# Patient Record
Sex: Male | Born: 1975 | Race: Black or African American | Hispanic: No | Marital: Married | Smoking: Never smoker
Health system: Southern US, Community
[De-identification: ages and names within clinical notes are randomized; demographics above are authoritative.]

## PROBLEM LIST (undated history)

## (undated) HISTORY — PX: WRIST SURGERY: SHX841

---

## 2006-10-23 ENCOUNTER — Emergency Department (HOSPITAL_COMMUNITY): Admission: EM | Admit: 2006-10-23 | Discharge: 2006-10-23 | Payer: Self-pay | Admitting: Emergency Medicine

## 2008-12-21 ENCOUNTER — Ambulatory Visit: Payer: Self-pay | Admitting: Gastroenterology

## 2010-09-14 ENCOUNTER — Emergency Department (HOSPITAL_COMMUNITY): Payer: Managed Care, Other (non HMO)

## 2010-09-14 ENCOUNTER — Emergency Department (HOSPITAL_COMMUNITY)
Admission: EM | Admit: 2010-09-14 | Discharge: 2010-09-14 | Disposition: A | Discharge status: Home / Self Care | Payer: Managed Care, Other (non HMO) | Attending: Emergency Medicine | Admitting: Emergency Medicine

## 2010-09-14 DIAGNOSIS — R110 Nausea: Secondary | ICD-10-CM | POA: Insufficient documentation

## 2010-09-14 DIAGNOSIS — R1032 Left lower quadrant pain: Secondary | ICD-10-CM | POA: Insufficient documentation

## 2010-09-14 DIAGNOSIS — R197 Diarrhea, unspecified: Secondary | ICD-10-CM | POA: Insufficient documentation

## 2010-09-14 LAB — COMPREHENSIVE METABOLIC PANEL
ALT: 52 U/L (ref 0–53)
AST: 38 U/L — ABNORMAL HIGH (ref 0–37)
Albumin: 3.7 g/dL (ref 3.5–5.2)
Alkaline Phosphatase: 54 U/L (ref 39–117)
Calcium: 9.3 mg/dL (ref 8.4–10.5)
GFR calc Af Amer: >60 mL/min (ref >60)
Glucose, Bld: 88 mg/dL (ref 70–99)
Potassium: 3.9 mEq/L (ref 3.5–5.1)
Sodium: 138 mEq/L (ref 135–145)
Total Protein: 7 g/dL (ref 6.0–8.3)

## 2010-09-14 LAB — DIFFERENTIAL
Basophils Absolute: 0 10*3/uL (ref 0.0–0.1)
Basophils Relative: 0 % (ref 0–1)
Eosinophils Relative: 3 % (ref 0–5)
Lymphocytes Relative: 31 % (ref 12–46)
Neutro Abs: 3.1 10*3/uL (ref 1.7–7.7)

## 2010-09-14 LAB — CBC
HCT: 42.8 % (ref 39.0–52.0)
Platelets: 174 10*3/uL (ref 150–400)
RDW: 12.1 % (ref 11.5–15.5)
WBC: 5.6 10*3/uL (ref 4.0–10.5)

## 2010-09-14 LAB — URINALYSIS, ROUTINE W REFLEX MICROSCOPIC
Bilirubin Urine: NEGATIVE
Glucose, UA: NEGATIVE mg/dL
Hgb urine dipstick: NEGATIVE
Ketones, ur: NEGATIVE mg/dL
Specific Gravity, Urine: 1.019 (ref 1.005–1.030)
pH: 6.5 (ref 5.0–8.0)

## 2010-09-14 MED ORDER — IOHEXOL 300 MG/ML  SOLN: 100.0000 mL | Freq: Once | INTRAMUSCULAR | Status: DC | PRN

## 2012-04-23 ENCOUNTER — Emergency Department: Payer: Self-pay | Admitting: Emergency Medicine

## 2012-09-22 IMAGING — CT CT ABD-PELV W/ CM
2 of 4 series · 17 of 46 positions shown, 19 images · IV contrast (agent unspecified)
Comparison: None.

CLINICAL DATA: Left lower quadrant abdominal pain.  Diarrhea.

CT ABDOMEN AND PELVIS WITH CONTRAST
TECHNIQUE: Multidetector CT imaging of the abdomen and pelvis was
performed following the standard protocol during bolus
administration of intravenous contrast.
Contrast: 100 ml Tmnipaque-EBB

[Series 3: routine · axial · 0.84mm/px · z∈[-486,-56]mm · 14 of 96 slices shown, 16 images]
[im 5/96  soft-tissue]
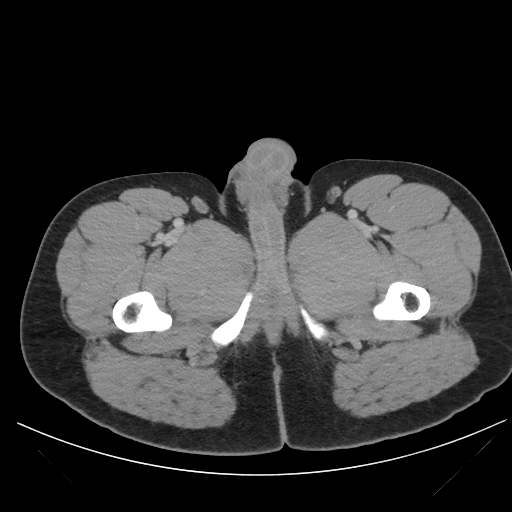
[im 5/96  bone]
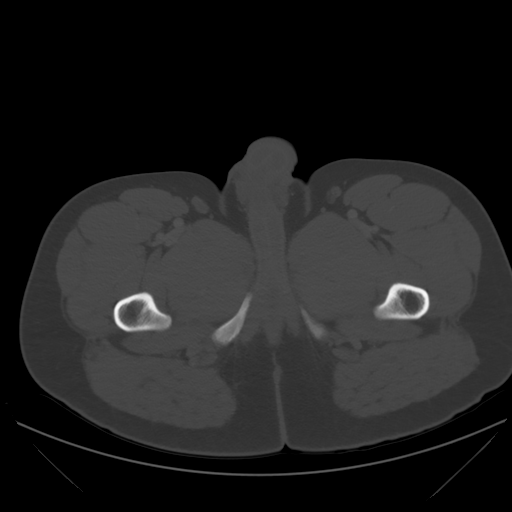
[im 13/96  soft-tissue]
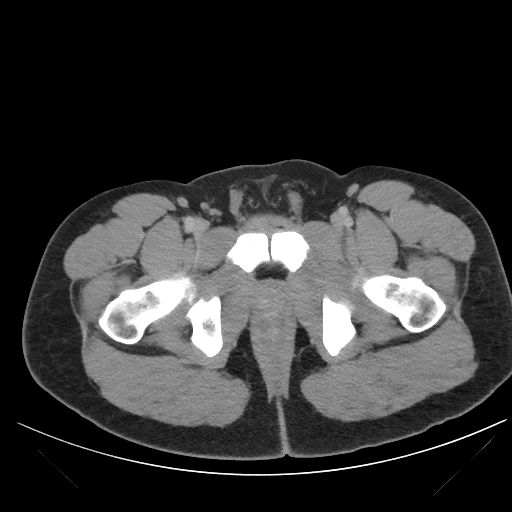
[im 17/96  soft-tissue]
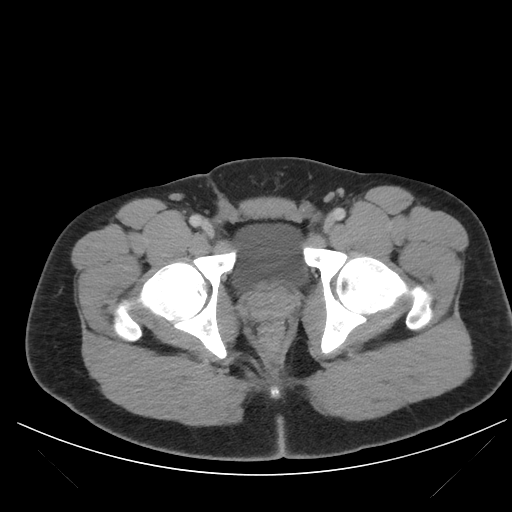
[im 25/96  soft-tissue]
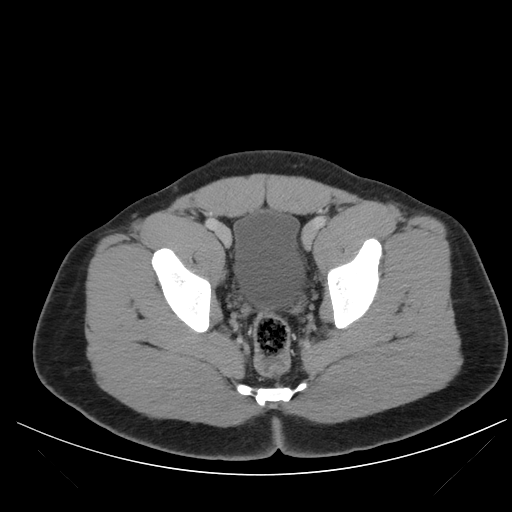
[im 34/96  soft-tissue]
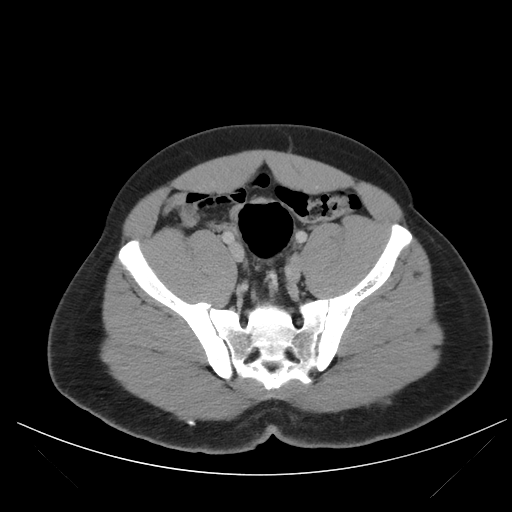
[im 38/96  soft-tissue]
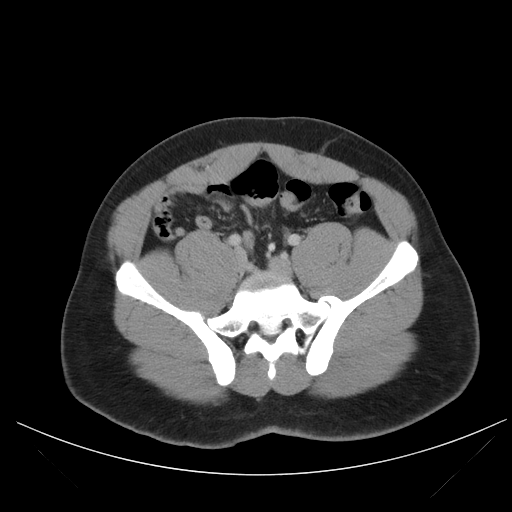
[im 46/96  soft-tissue]
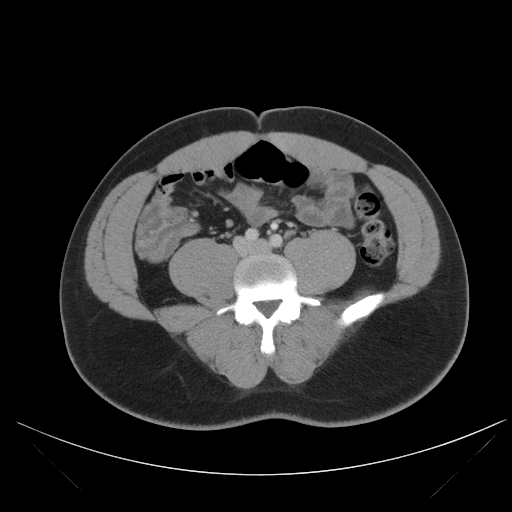
[im 50/96  soft-tissue]
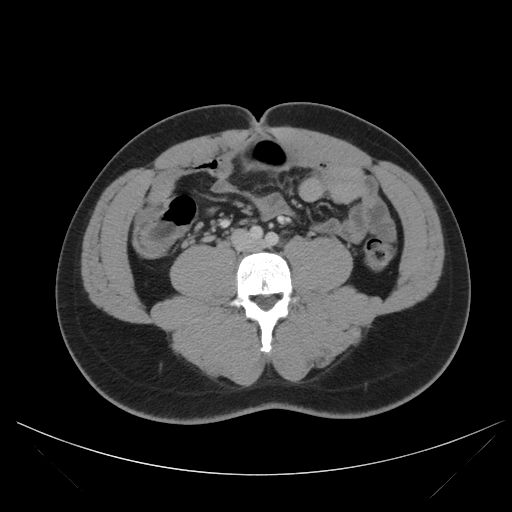
[im 58/96  soft-tissue]
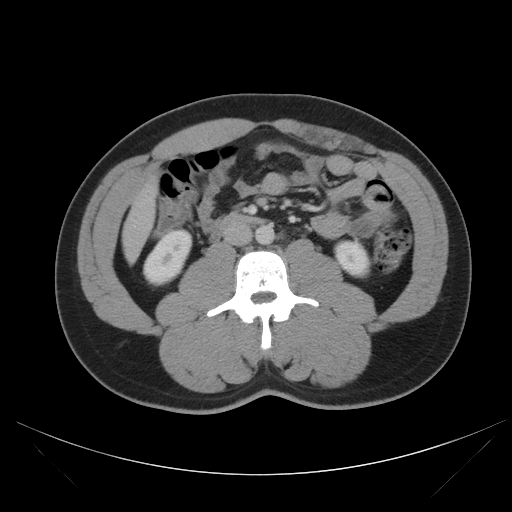
[im 58/96  bone]
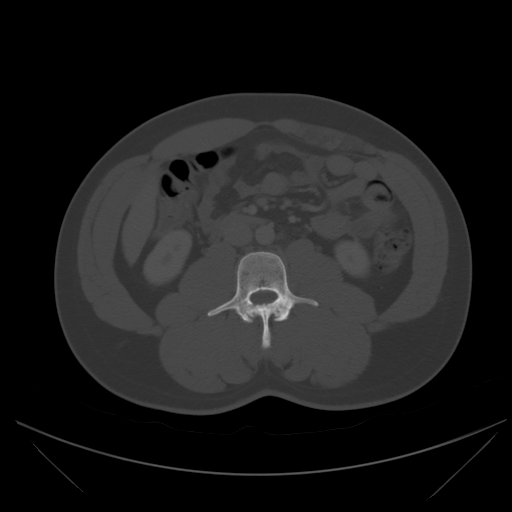
[im 62/96  soft-tissue]
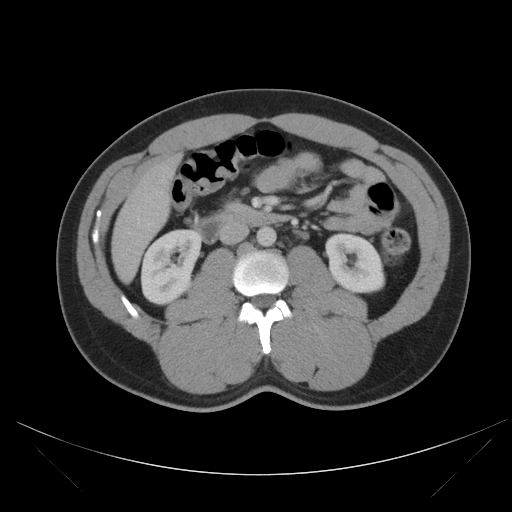
[im 71/96  soft-tissue]
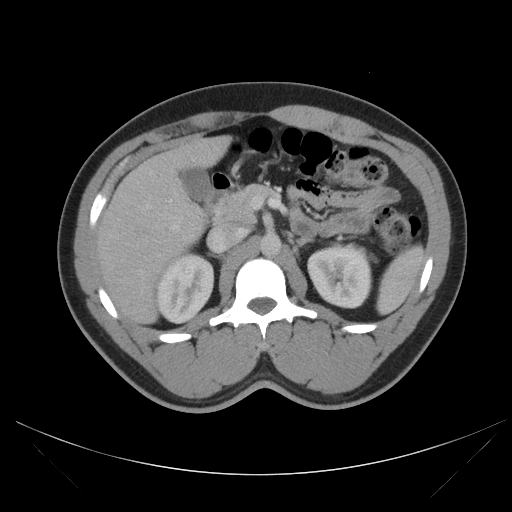
[im 79/96  soft-tissue]
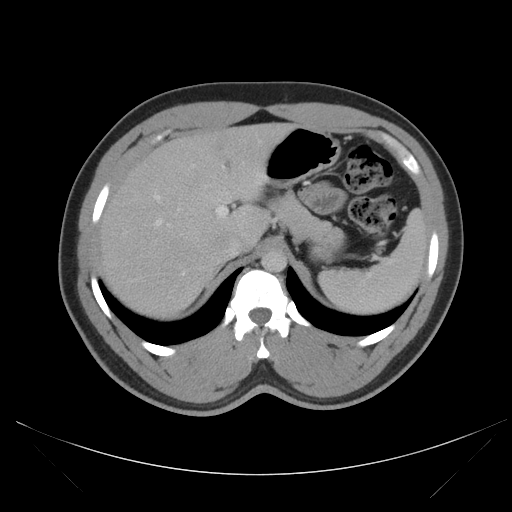
[im 83/96  soft-tissue]
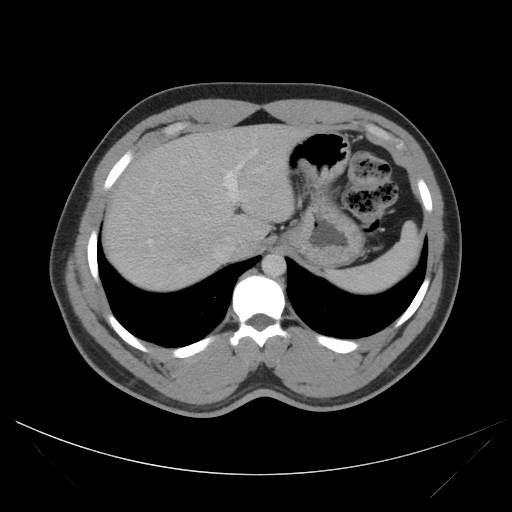
[im 91/96  soft-tissue]
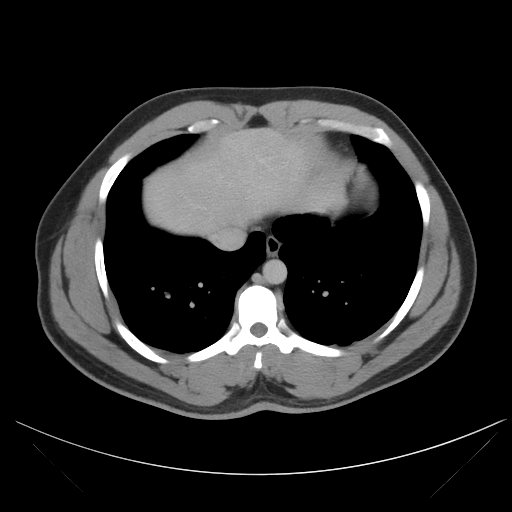

[cor · coronal · 0.93mm/px · 3 of 104 slices shown]
[im 35/104  soft-tissue]
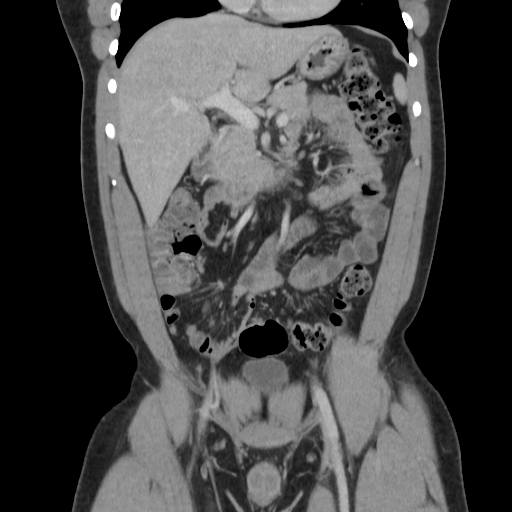
[im 46/104  soft-tissue]
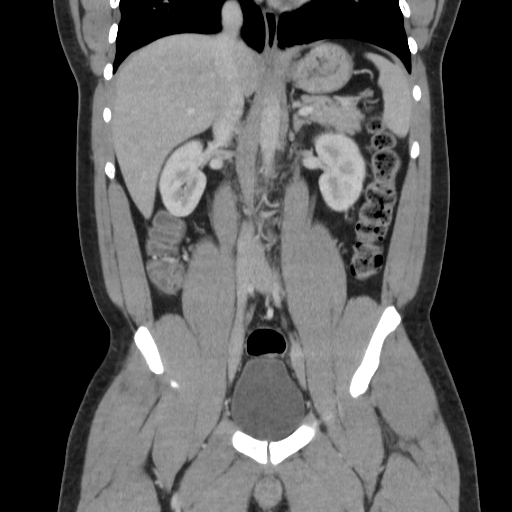
[im 58/104  soft-tissue]
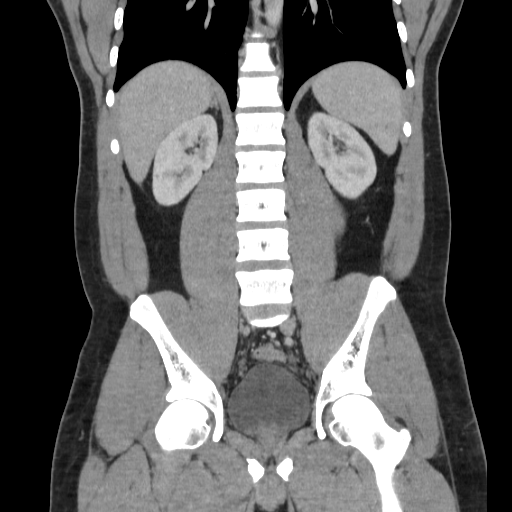

[17 of 46 positions shown; findings below may reference images not displayed]

FINDINGS: Normal appearing liver, spleen, pancreas, gallbladder,
adrenal glands, kidneys, urinary bladder and prostate gland with
the exception of minimal central prostatic calcification.  No
gastrointestinal abnormalities or enlarged lymph nodes.  Normal
appearing appendix in the right upper pelvis, anteriorly.  Clear
lung bases.  Normal appearing bones.
IMPRESSION: No significant abnormality.

## 2018-01-19 ENCOUNTER — Encounter (HOSPITAL_COMMUNITY): Payer: Self-pay | Admitting: Emergency Medicine

## 2018-01-19 ENCOUNTER — Emergency Department (HOSPITAL_COMMUNITY)
Admission: EM | Admit: 2018-01-19 | Discharge: 2018-01-19 | Disposition: A | Discharge status: Home / Self Care | Payer: 59 | Attending: Emergency Medicine | Admitting: Emergency Medicine

## 2018-01-19 ENCOUNTER — Other Ambulatory Visit: Payer: Self-pay

## 2018-01-19 DIAGNOSIS — R42 Dizziness and giddiness: Secondary | ICD-10-CM | POA: Diagnosis not present

## 2018-01-19 DIAGNOSIS — R112 Nausea with vomiting, unspecified: Secondary | ICD-10-CM | POA: Insufficient documentation

## 2018-01-19 DIAGNOSIS — R197 Diarrhea, unspecified: Secondary | ICD-10-CM | POA: Diagnosis not present

## 2018-01-19 LAB — URINALYSIS, ROUTINE W REFLEX MICROSCOPIC
BILIRUBIN URINE: NEGATIVE
Glucose, UA: NEGATIVE mg/dL
Hgb urine dipstick: NEGATIVE
KETONES UR: NEGATIVE mg/dL
Leukocytes, UA: NEGATIVE
NITRITE: NEGATIVE
PROTEIN: NEGATIVE mg/dL
Specific Gravity, Urine: 1.018 (ref 1.005–1.030)
pH: 6 (ref 5.0–8.0)

## 2018-01-19 LAB — CBC
HCT: 42.6 % (ref 39.0–52.0)
Hemoglobin: 14.6 g/dL (ref 13.0–17.0)
MCH: 32.4 pg (ref 26.0–34.0)
MCHC: 34.3 g/dL (ref 30.0–36.0)
MCV: 94.7 fL (ref 78.0–100.0)
Platelets: 182 10*3/uL (ref 150–400)
RBC: 4.5 MIL/uL (ref 4.22–5.81)
RDW: 11.9 % (ref 11.5–15.5)
WBC: 3.8 10*3/uL — ABNORMAL LOW (ref 4.0–10.5)

## 2018-01-19 LAB — COMPREHENSIVE METABOLIC PANEL
ALBUMIN: 4.1 g/dL (ref 3.5–5.0)
ALT: 26 U/L (ref 0–44)
ANION GAP: 8 (ref 5–15)
AST: 28 U/L (ref 15–41)
Alkaline Phosphatase: 37 U/L — ABNORMAL LOW (ref 38–126)
BILIRUBIN TOTAL: 0.6 mg/dL (ref 0.3–1.2)
BUN: 10 mg/dL (ref 6–20)
CALCIUM: 9.2 mg/dL (ref 8.9–10.3)
CO2: 26 mmol/L (ref 22–32)
Chloride: 104 mmol/L (ref 98–111)
Creatinine, Ser: 1.32 mg/dL — ABNORMAL HIGH (ref 0.61–1.24)
GFR calc Af Amer: >60 mL/min (ref >60)
GLUCOSE: 108 mg/dL — AB (ref 70–99)
POTASSIUM: 4 mmol/L (ref 3.5–5.1)
Sodium: 138 mmol/L (ref 135–145)
TOTAL PROTEIN: 7.4 g/dL (ref 6.5–8.1)

## 2018-01-19 LAB — LIPASE, BLOOD: Lipase: 58 U/L — ABNORMAL HIGH (ref 11–51)

## 2018-01-19 LAB — TROPONIN I

## 2018-01-19 MED ORDER — ONDANSETRON HCL 4 MG/2ML IJ SOLN
4.0000 mg | Freq: Once | INTRAMUSCULAR | Status: AC
  Administered 2018-01-19: 4 mg via INTRAVENOUS
  Filled 2018-01-19: qty 2

## 2018-01-19 MED ORDER — ONDANSETRON 4 MG PO TBDP: 4.0000 mg | ORAL_TABLET | Freq: Three times a day (TID) | ORAL | 0 refills | Status: AC | PRN

## 2018-01-19 MED ORDER — DICYCLOMINE HCL 20 MG PO TABS: 20.0000 mg | ORAL_TABLET | Freq: Two times a day (BID) | ORAL | 0 refills | Status: AC

## 2018-01-19 MED ORDER — DICYCLOMINE HCL 10 MG PO CAPS
20.0000 mg | ORAL_CAPSULE | Freq: Once | ORAL | Status: AC
  Administered 2018-01-19: 20 mg via ORAL
  Filled 2018-01-19: qty 2

## 2018-01-19 MED ORDER — SODIUM CHLORIDE 0.9 % IV BOLUS
1000.0000 mL | Freq: Once | INTRAVENOUS | Status: AC
Start: 2018-01-19 — End: 2018-01-19
  Administered 2018-01-19: 1000 mL via INTRAVENOUS

## 2018-01-19 NOTE — ED Notes (Signed)
Pt ambulated without assistance to restroom with steady gait.

## 2018-01-19 NOTE — ED Provider Notes (Signed)
[facility] EMERGENCY DEPARTMENT Provider Note   CSN: [id number] Arrival date & time: 01/19/18  0941     History   Chief Complaint Chief Complaint  Patient presents with  . Dizziness  . Abdominal Pain    HPI Devon Fisher is a 42 y.o. male.  HPI   Devon Fisher is a 42 y.o. male, patient with no pertinent past medical history, presenting to the ED with dizziness.  Patient states he was at work when he began to feel room spinning dizziness and lightheadedness followed closely by a "gurgling" and cramping in the left side of the abdomen.  Accompanied by nausea.  Patient left work and went home where he had an instance of diarrhea.  On his way to the ED, he vomited. Symptoms improved after vomiting.   Patient's symptoms arose not long after eating a sandwich that he suspects may have been the cause for the symptoms.  Denies fever, chest pain, shortness of breath, hematochezia/melena, hematemesis, syncope, or any other complaints.    History reviewed. No pertinent past medical history.  There are no active problems to display for this patient.   Past Surgical History:  Procedure Laterality Date  . WRIST SURGERY Right         Home Medications    Prior to Admission medications   Medication Sig Start Date End Date Taking? Authorizing Provider  naproxen sodium (ALEVE) 220 MG tablet Take 220 mg by mouth daily as needed (Headache).   Yes [provider]  dicyclomine (BENTYL) 20 MG tablet Take 1 tablet (20 mg total) by mouth 2 (two) times daily. 01/19/18   Obert Espindola C, PA-C  ondansetron (ZOFRAN ODT) 4 MG disintegrating tablet Take 1 tablet (4 mg total) by mouth every 8 (eight) hours as needed for nausea or vomiting. 01/19/18   Salih Williamson, Hillard Danker, PA-C    Family History No family history on file.  Social History Social History   Tobacco Use  . Smoking status: Never Smoker  . Smokeless tobacco: Never Used  Substance Use Topics  . Alcohol use: Not  Currently  . Drug use: Not on file     Allergies   Aspirin and Penicillins   Review of Systems Review of Systems  Constitutional: Negative for chills and fever.  Respiratory: Negative for shortness of breath.   Cardiovascular: Negative for chest pain.  Gastrointestinal: Positive for abdominal pain, diarrhea, nausea and vomiting. Negative for blood in stool.  Genitourinary: Negative for dysuria, frequency, hematuria and testicular pain.  Musculoskeletal: Negative for back pain.  Neurological: Positive for dizziness and light-headedness. Negative for syncope, weakness, numbness and headaches.  All other systems reviewed and are negative.    Physical Exam Updated Vital Signs BP 128/80 (BP Location: Right Arm)   Pulse 87   Temp 97.9 F (36.6 C) (Oral)   Resp 16   Ht 5\' 11"  (1.803 m)   Wt 104.3 kg   SpO2 100%   BMI 32.08 kg/m   Physical Exam  Constitutional: He is oriented to person, place, and time. He appears well-developed and well-nourished. No distress.  HENT:  Head: Normocephalic and atraumatic.  Eyes: Pupils are equal, round, and reactive to light. Conjunctivae and EOM are normal.  Neck: Neck supple.  Cardiovascular: Normal rate, regular rhythm, normal heart sounds and intact distal pulses.  Pulmonary/Chest: Effort normal and breath sounds normal. No respiratory distress.  Abdominal: Soft. There is no tenderness. There is no guarding.  Musculoskeletal: He exhibits no edema.  Lymphadenopathy:    He has no cervical adenopathy.  Neurological: He is alert and oriented to person, place, and time.  Sensation grossly intact to light touch in the extremities. Strength 5/5 in all extremities. No gait disturbance. Coordination intact. Cranial nerves III-XII grossly intact. No facial droop.   HINTS-Plus Exam Head Impulse: Abnormal, thus reassuring Nystagmus: Unidirectional nystagmus noted. No bidirectional, rotation, or vertical nystagmus noted. Test of Skew: No abnormal  skew Hearing: No noted acute hearing deficit with finger rub testing  Skin: Skin is warm and dry. He is not diaphoretic.  Psychiatric: He has a normal mood and affect. His behavior is normal.  Nursing note and vitals reviewed.    ED Treatments / Results  Labs (all labs ordered are listed, but only abnormal results are displayed) Labs Reviewed  LIPASE, BLOOD - Abnormal; Notable for the following components:      Result Value   Lipase 58 (*)    All other components within normal limits  COMPREHENSIVE METABOLIC PANEL - Abnormal; Notable for the following components:   Glucose, Bld 108 (*)    Creatinine, Ser 1.32 (*)    Alkaline Phosphatase 37 (*)    All other components within normal limits  CBC - Abnormal; Notable for the following components:   WBC 3.8 (*)    All other components within normal limits  URINALYSIS, ROUTINE W REFLEX MICROSCOPIC  TROPONIN I   BUN  Date Value Ref Range Status  01/19/2018 10 6 - 20 mg/dL Final  16/01/9603 13 6 - 23 mg/dL Final   Creatinine, Ser  Date Value Ref Range Status  01/19/2018 1.32 (H) 0.61 - 1.24 mg/dL Final  54/12/8117 1.47 0.4 - 1.5 mg/dL Final     EKG EKG Interpretation  Date/Time:  Tuesday January 19 2018 09:46:02 EDT Ventricular Rate:  89 PR Interval:  164 QRS Duration: 80 QT Interval:  358 QTC Calculation: 435 R Axis:   94 Text Interpretation:  Normal sinus rhythm Rightward axis Borderline ECG Confirmed by Kennis Carina [phone]) on 01/19/2018 12:37:06 PM   Radiology No results found.  Procedures Procedures (including critical care time)  Medications Ordered in ED Medications  sodium chloride 0.9 % bolus 1,000 mL (0 mLs Intravenous Stopped 01/19/18 1355)  ondansetron (ZOFRAN) injection 4 mg (4 mg Intravenous Given 01/19/18 1212)  dicyclomine (BENTYL) capsule 20 mg (20 mg Oral Given 01/19/18 1243)     Initial Impression / Assessment and Plan / ED Course  I have reviewed the triage vital signs and the nursing  notes.  Pertinent labs & imaging results that were available during my care of the patient were reviewed by me and considered in my medical decision making (see chart for details).  Clinical Course as of Jan 19 1613  Tue Jan 19, 2018  1245 Patient states symptoms have resolved. He was able to get out of bed without noted hesitation and ambulated with a steady gait.   [SJ]    Clinical Course User Index [SJ] Garrie Elenes C, PA-C    Patient presents with complaint of lightheadedness, nausea, vomiting, and diarrhea.  Patient is nontoxic appearing, afebrile, not tachycardic, not tachypneic, not hypotensive, maintains excellent SPO2 on room air, and is in no apparent distress. Patient's symptoms could have been food related.  His symptoms resolved with minimal intervention and did not recur. The patient was given instructions for home care as well as return precautions. Patient voices understanding of these instructions, accepts the plan, and is comfortable with discharge.  Vitals:   01/19/18 1228 01/19/18 1239 01/19/18 1245 01/19/18 1345  BP:    128/74  Pulse: 70 78 79 78  Resp: 18 18 16 16   Temp:      TempSrc:      SpO2: 100% 100% 100% 100%  Weight:      Height:       Orthostatic VS for the past 24 hrs:  BP- Lying Pulse- Lying BP- Sitting Pulse- Sitting BP- Standing at 0 minutes Pulse- Standing at 0 minutes  01/19/18 1203 129/82 83 126/87 73 122/89 69     Final Clinical Impressions(s) / ED Diagnoses   Final diagnoses:  Lightheadedness    ED Discharge Orders         Ordered    ondansetron (ZOFRAN ODT) 4 MG disintegrating tablet  Every 8 hours PRN     01/19/18 1344    dicyclomine (BENTYL) 20 MG tablet  2 times daily     01/19/18 1344           Anselm Pancoast, PA-C 01/19/18 1615    Sabas Sous, MD 01/20/18 1413

## 2018-01-19 NOTE — ED Notes (Signed)
Pt stable, ambulatory, and verbalizes understanding of d/c instructions.  

## 2018-01-19 NOTE — ED Notes (Signed)
ED Provider at bedside. 

## 2018-01-19 NOTE — Discharge Instructions (Addendum)
Nausea, Vomiting, and Diarrhea ° °Hand washing: Wash your hands throughout the day, but especially before and after touching the face, using the restroom, sneezing, coughing, or touching surfaces that have been coughed or sneezed upon. °Hydration: Symptoms will be intensified and complicated by dehydration. Dehydration can also extend the duration of symptoms. Drink plenty of fluids and get plenty of rest. You should be drinking at least half a liter of water an hour to stay hydrated. Electrolyte drinks (ex. Gatorade, Powerade, Pedialyte) are also encouraged. You should be drinking enough fluids to make your urine light yellow, almost clear. If this is not the case, you are not drinking enough water. °Please note that some of the treatments indicated below will not be effective if you are not adequately hydrated. °Diet: Please concentrate on hydration, however, you may introduce food slowly.  Start with a clear liquid diet, progressed to a full liquid diet, and then bland solids as you are able. °Pain or fever: Ibuprofen, Naproxen, or Tylenol for pain or fever.  °Nausea/vomiting: Use the Zofran for nausea or vomiting. °Diarrhea: May use medications such as loperamide (Imodium) or Bismuth subsalicylate (Pepto-Bismol). °Bentyl: This medication is what is known as an antispasmodic and is intended to help reduce abdominal discomfort. °Follow-up: Follow-up with a primary care provider on this matter. °Return: Return should you develop a fever, bloody diarrhea, increased abdominal pain, uncontrolled vomiting, or any other major concerns. ° °For prescription assistance, may try using prescription discount sites or apps, such as goodrx.com °

## 2018-01-19 NOTE — ED Triage Notes (Signed)
Patient reports at work today he had a sudden onset of dizziness with emesis and diarrhea. Pt reports feeling better after vomitting but sat down and dizziness and has been constant. No LOC.

## 2019-05-21 ENCOUNTER — Other Ambulatory Visit: Payer: Self-pay

## 2019-05-21 DIAGNOSIS — Z20822 Contact with and (suspected) exposure to covid-19: Secondary | ICD-10-CM

## 2019-05-22 LAB — NOVEL CORONAVIRUS, NAA: SARS-CoV-2, NAA: NOT DETECTED

## 2019-08-13 ENCOUNTER — Ambulatory Visit: Payer: Managed Care, Other (non HMO) | Attending: Internal Medicine

## 2019-08-13 DIAGNOSIS — Z23 Encounter for immunization: Secondary | ICD-10-CM

## 2019-08-13 NOTE — Progress Notes (Signed)
   Covid-19 Vaccination [facility]  Name:  Devon Fisher    MRN: [id number] DOB: May 21, 1975  08/13/2019  [name] was observed post Covid-19 immunization for 15 minutes without incident. He was provided with Vaccine Information Sheet and instruction to access the V-Safe system.   [name] was instructed to call [phone] with any severe reactions post vaccine: Marland Kitchen Difficulty breathing  . Swelling of face and throat  . A fast heartbeat  . A bad rash all over body  . Dizziness and weakness   Immunizations Administered    Name Date Dose VIS Date Route   Pfizer COVID-19 Vaccine 08/13/2019  3:50 PM 0.3 mL 06/15/2018 Intramuscular   Manufacturer: [employer], [employer]   Lot: [id number]   NDC: [address]-4

## 2021-05-09 ENCOUNTER — Other Ambulatory Visit: Payer: Self-pay | Admitting: Sports Medicine

## 2021-05-09 ENCOUNTER — Other Ambulatory Visit: Payer: Self-pay

## 2021-05-09 ENCOUNTER — Ambulatory Visit
Admission: RE | Admit: 2021-05-09 | Discharge: 2021-05-09 | Disposition: A | Discharge status: Home / Self Care | Payer: Managed Care, Other (non HMO) | Source: Ambulatory Visit | Attending: Sports Medicine | Admitting: Sports Medicine

## 2021-05-09 DIAGNOSIS — M542 Cervicalgia: Secondary | ICD-10-CM

## 2021-05-09 DIAGNOSIS — M25512 Pain in left shoulder: Secondary | ICD-10-CM

## 2021-05-09 DIAGNOSIS — M25511 Pain in right shoulder: Secondary | ICD-10-CM

## 2022-09-18 ENCOUNTER — Ambulatory Visit: Payer: Commercial Managed Care - HMO | Admitting: Physical Therapy

## 2023-04-22 ENCOUNTER — Other Ambulatory Visit: Payer: Self-pay | Admitting: Medical Genetics

## 2023-05-14 ENCOUNTER — Other Ambulatory Visit (HOSPITAL_COMMUNITY)
Admission: RE | Admit: 2023-05-14 | Discharge: 2023-05-14 | Disposition: A | Discharge status: Home / Self Care | Payer: Self-pay | Source: Ambulatory Visit | Attending: Oncology | Admitting: Oncology

## 2023-05-18 IMAGING — CR DG SHOULDER 2+V*R*
3 series · 3 of 3 positions shown · non-contrast
Comparison: None.

CLINICAL DATA: Right shoulder pain.

EXAM:
RIGHT SHOULDER - 2+ VIEW

[w shoulder grashey right]
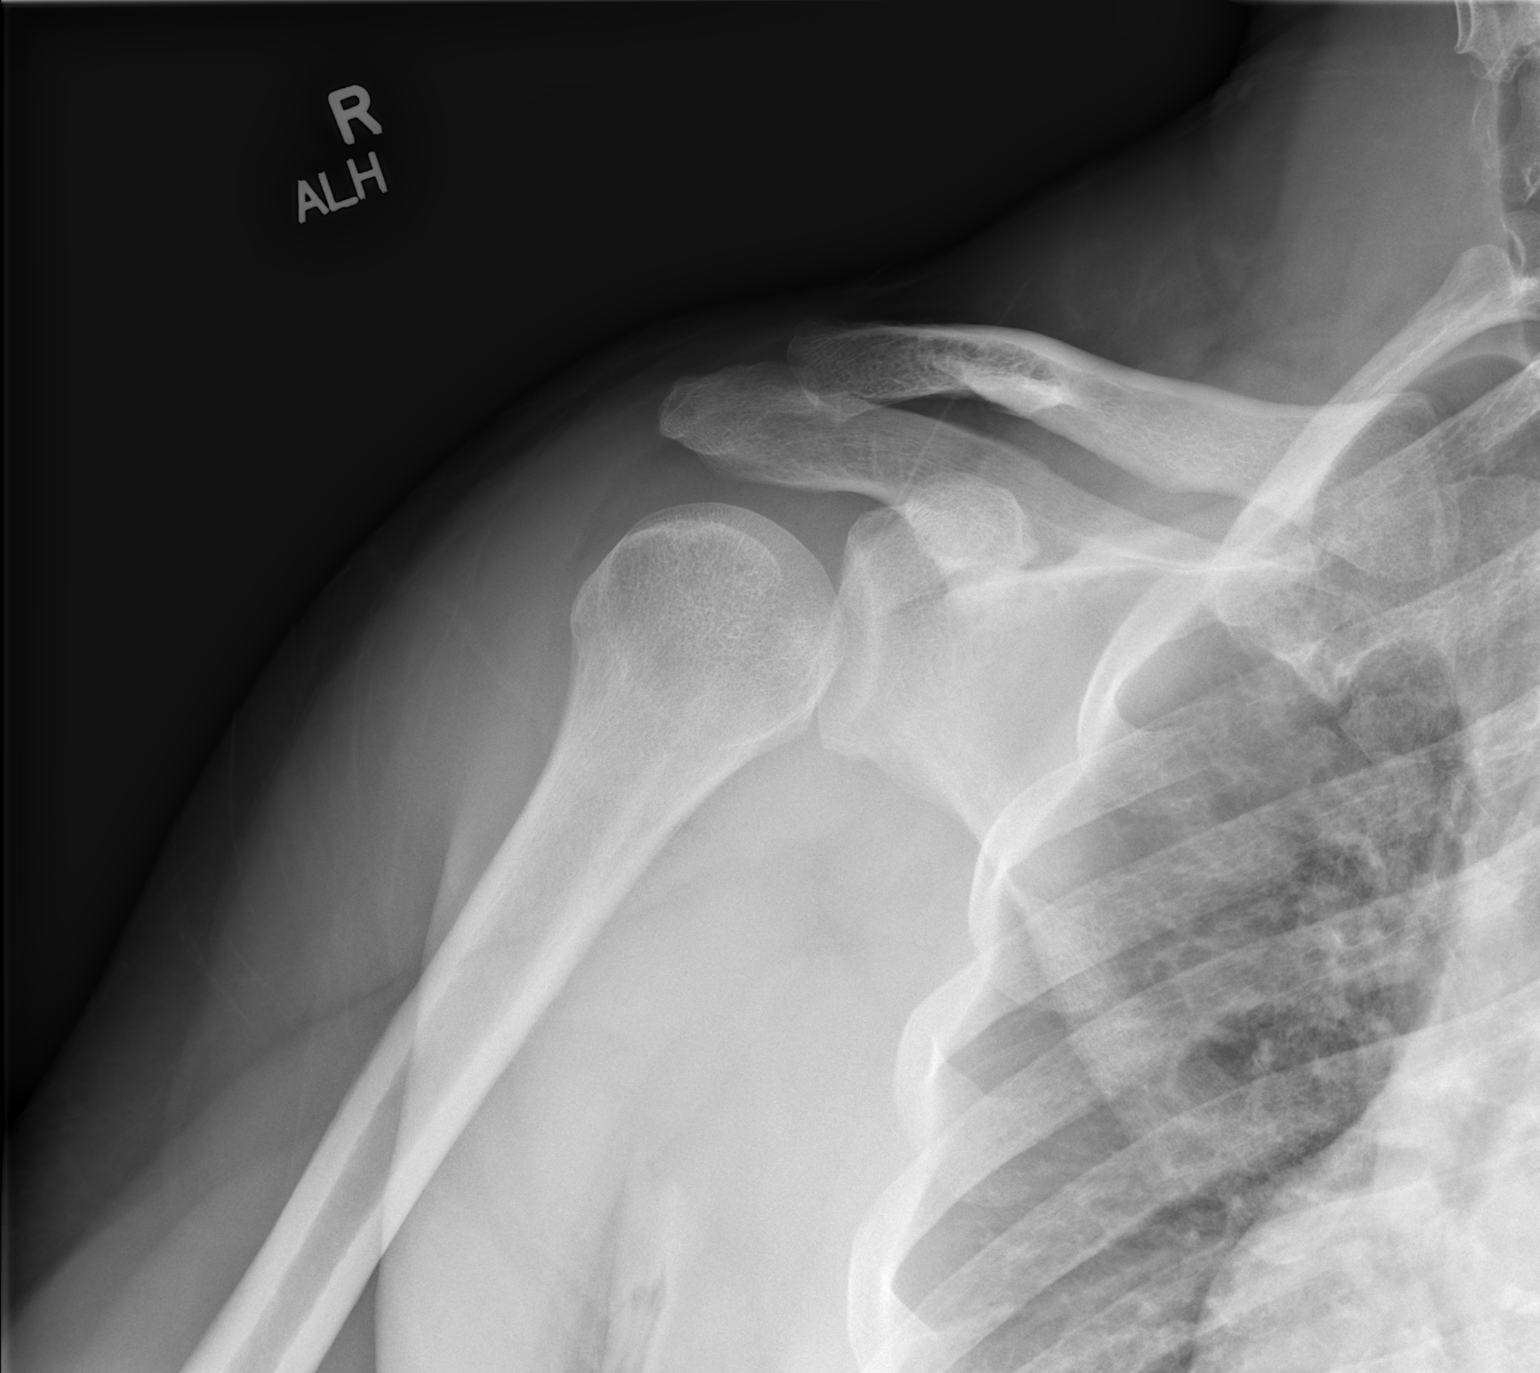

[w shoulder y-view right]
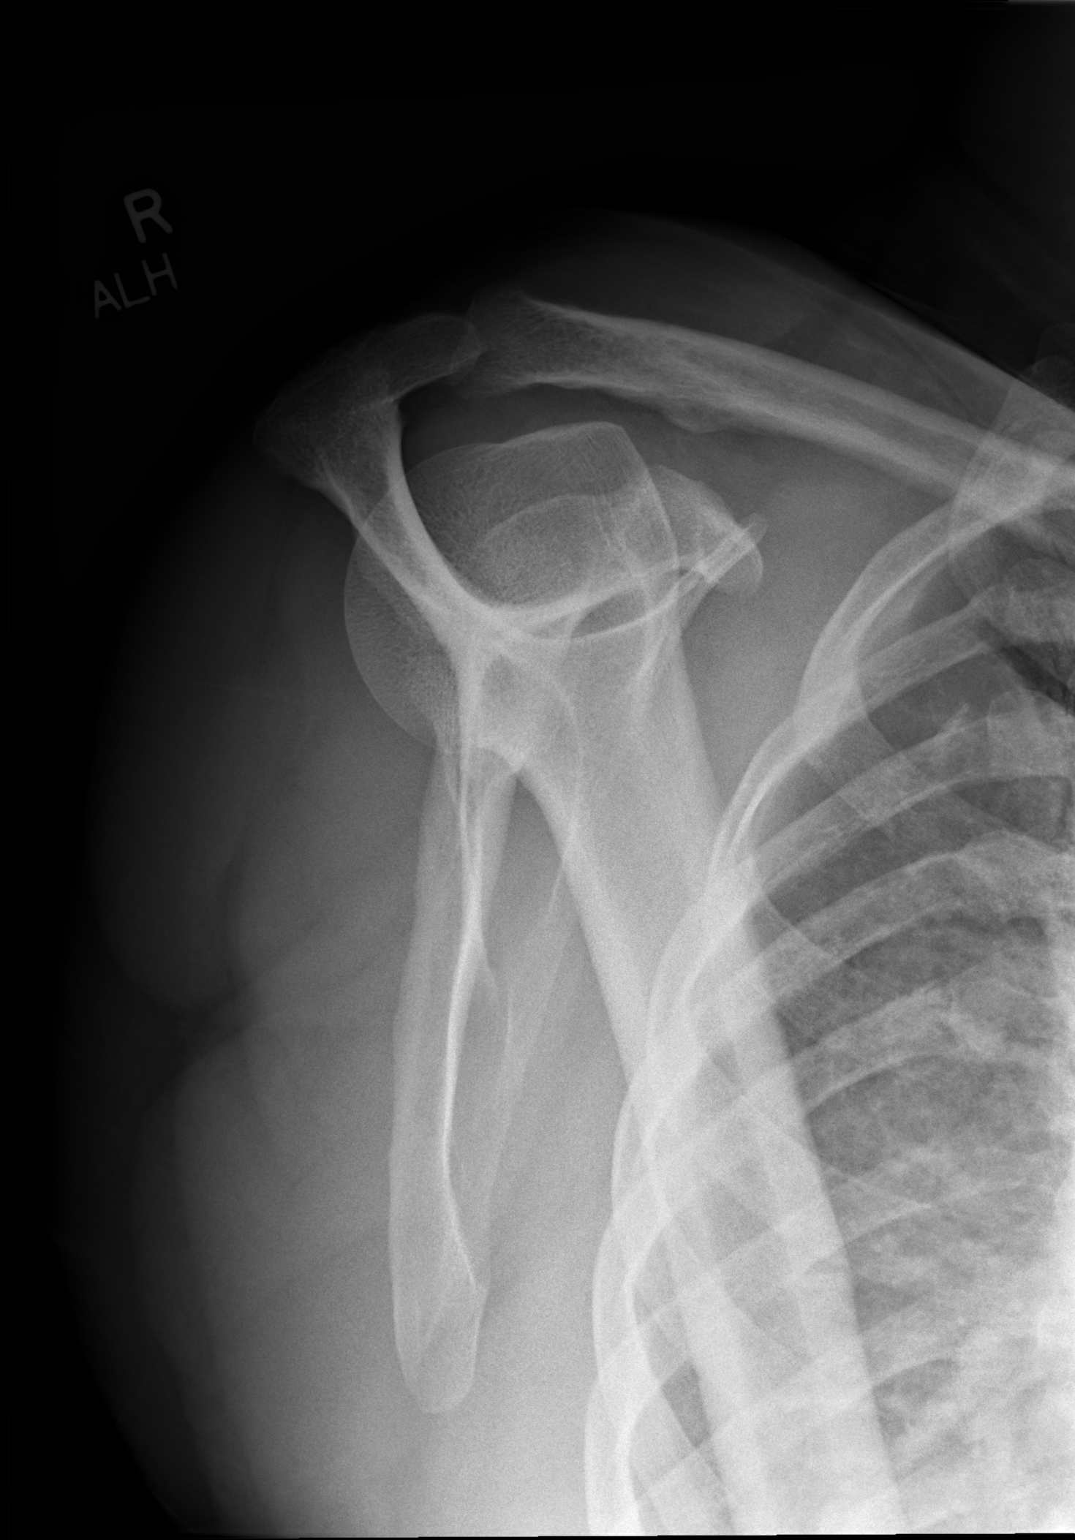

[w shoulder axillary right]
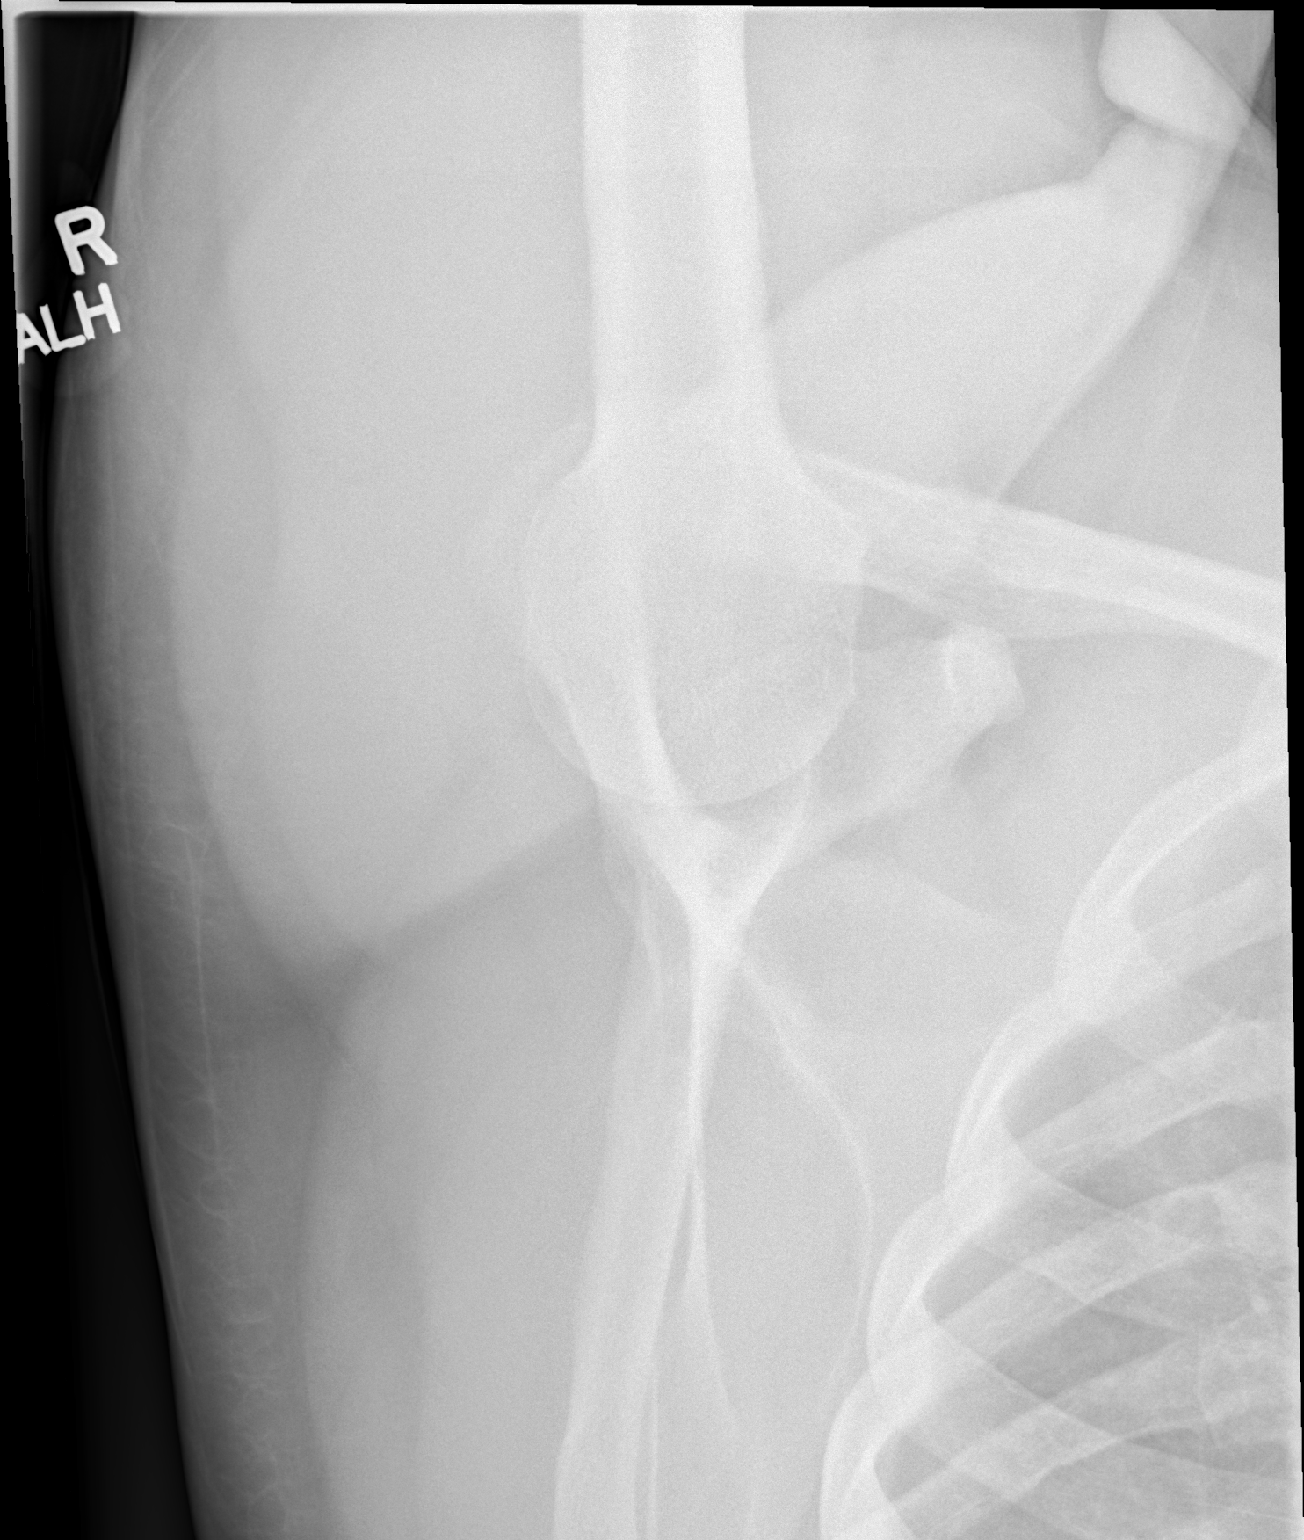

[3 of 3 positions shown; findings below may reference images not displayed]

FINDINGS: Mild peripheral acromioclavicular joint degenerative osteophytosis.
The glenohumeral joint space is maintained. No acute fracture or
dislocation. The visualized portion of the right lung is
unremarkable.
IMPRESSION: Very mild right acromioclavicular osteoarthritis.

## 2023-05-26 LAB — GENECONNECT MOLECULAR SCREEN: Genetic Analysis Overall Interpretation: NEGATIVE
